# Patient Record
Sex: Female | Born: 1975 | Race: White | Hispanic: No | Marital: Single | Smoking: Never smoker
Health system: Southern US, Community
[De-identification: ages and names within clinical notes are randomized; demographics above are authoritative.]

## PROBLEM LIST (undated history)

## (undated) DIAGNOSIS — Z789 Other specified health status: Secondary | ICD-10-CM

## (undated) HISTORY — PX: NO PAST SURGERIES: SHX2092

## (undated) HISTORY — PX: GALLBLADDER SURGERY: SHX652

---

## 2010-12-06 ENCOUNTER — Other Ambulatory Visit (HOSPITAL_COMMUNITY)
Admission: RE | Admit: 2010-12-06 | Discharge: 2010-12-06 | Disposition: A | Discharge status: Home / Self Care | Payer: Managed Care, Other (non HMO) | Source: Ambulatory Visit | Attending: Obstetrics and Gynecology | Admitting: Obstetrics and Gynecology

## 2010-12-06 DIAGNOSIS — Z01419 Encounter for gynecological examination (general) (routine) without abnormal findings: Secondary | ICD-10-CM | POA: Insufficient documentation

## 2010-12-06 DIAGNOSIS — Z1159 Encounter for screening for other viral diseases: Secondary | ICD-10-CM | POA: Insufficient documentation

## 2010-12-06 DIAGNOSIS — Z113 Encounter for screening for infections with a predominantly sexual mode of transmission: Secondary | ICD-10-CM | POA: Insufficient documentation

## 2011-03-03 ENCOUNTER — Ambulatory Visit
Admission: RE | Admit: 2011-03-03 | Discharge: 2011-03-03 | Disposition: A | Discharge status: Home / Self Care | Payer: Managed Care, Other (non HMO) | Source: Ambulatory Visit | Attending: Family Medicine | Admitting: Family Medicine

## 2011-03-03 ENCOUNTER — Other Ambulatory Visit: Payer: Self-pay | Admitting: Family Medicine

## 2011-03-03 DIAGNOSIS — Z0000 Encounter for general adult medical examination without abnormal findings: Secondary | ICD-10-CM

## 2012-02-08 ENCOUNTER — Other Ambulatory Visit (HOSPITAL_COMMUNITY)
Admission: RE | Admit: 2012-02-08 | Discharge: 2012-02-08 | Disposition: A | Discharge status: Home / Self Care | Payer: BC Managed Care – PPO | Source: Ambulatory Visit | Attending: Obstetrics and Gynecology | Admitting: Obstetrics and Gynecology

## 2012-02-08 DIAGNOSIS — Z01419 Encounter for gynecological examination (general) (routine) without abnormal findings: Secondary | ICD-10-CM | POA: Insufficient documentation

## 2014-01-10 IMAGING — CR DG CHEST 2V
2 series · 2 of 2 positions shown · non-contrast
Comparison: None.

CLINICAL DATA: Positive TB skin test, routine physical exam

CHEST - 2 VIEW

[view not recorded (1 of 2)]
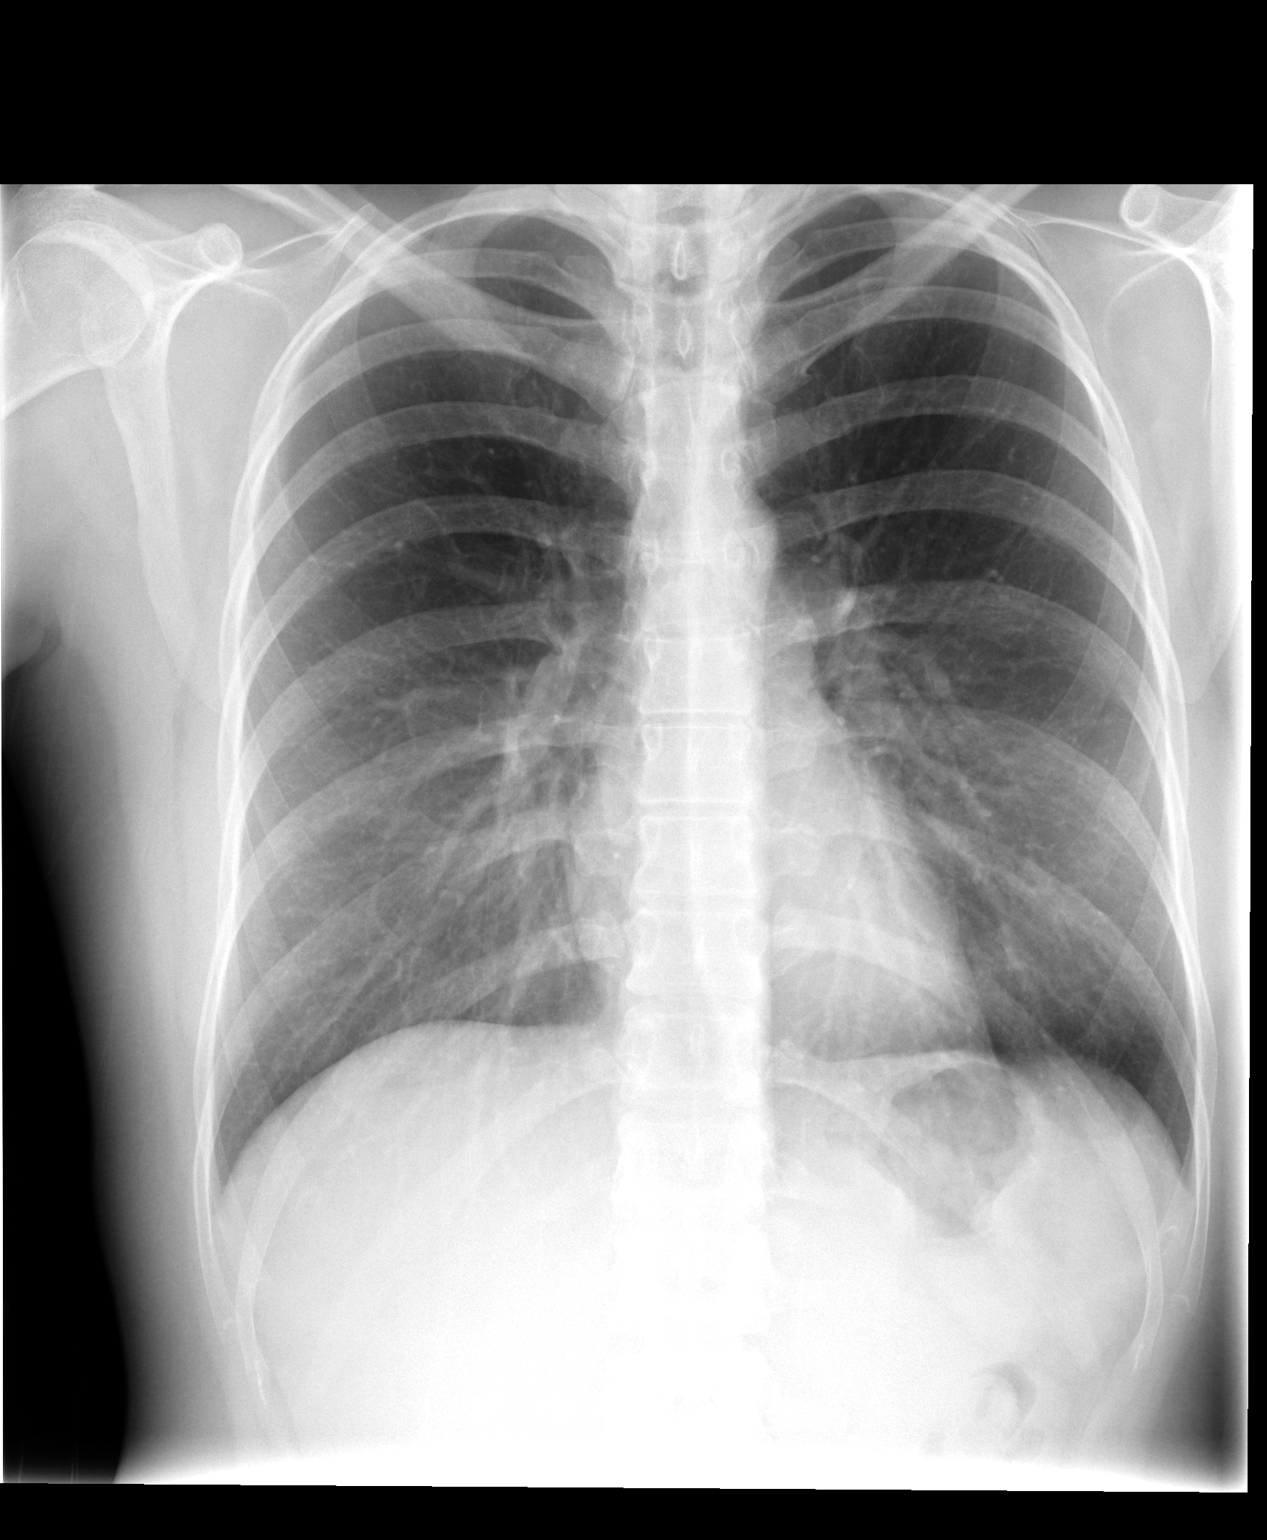

[view not recorded (2 of 2)]
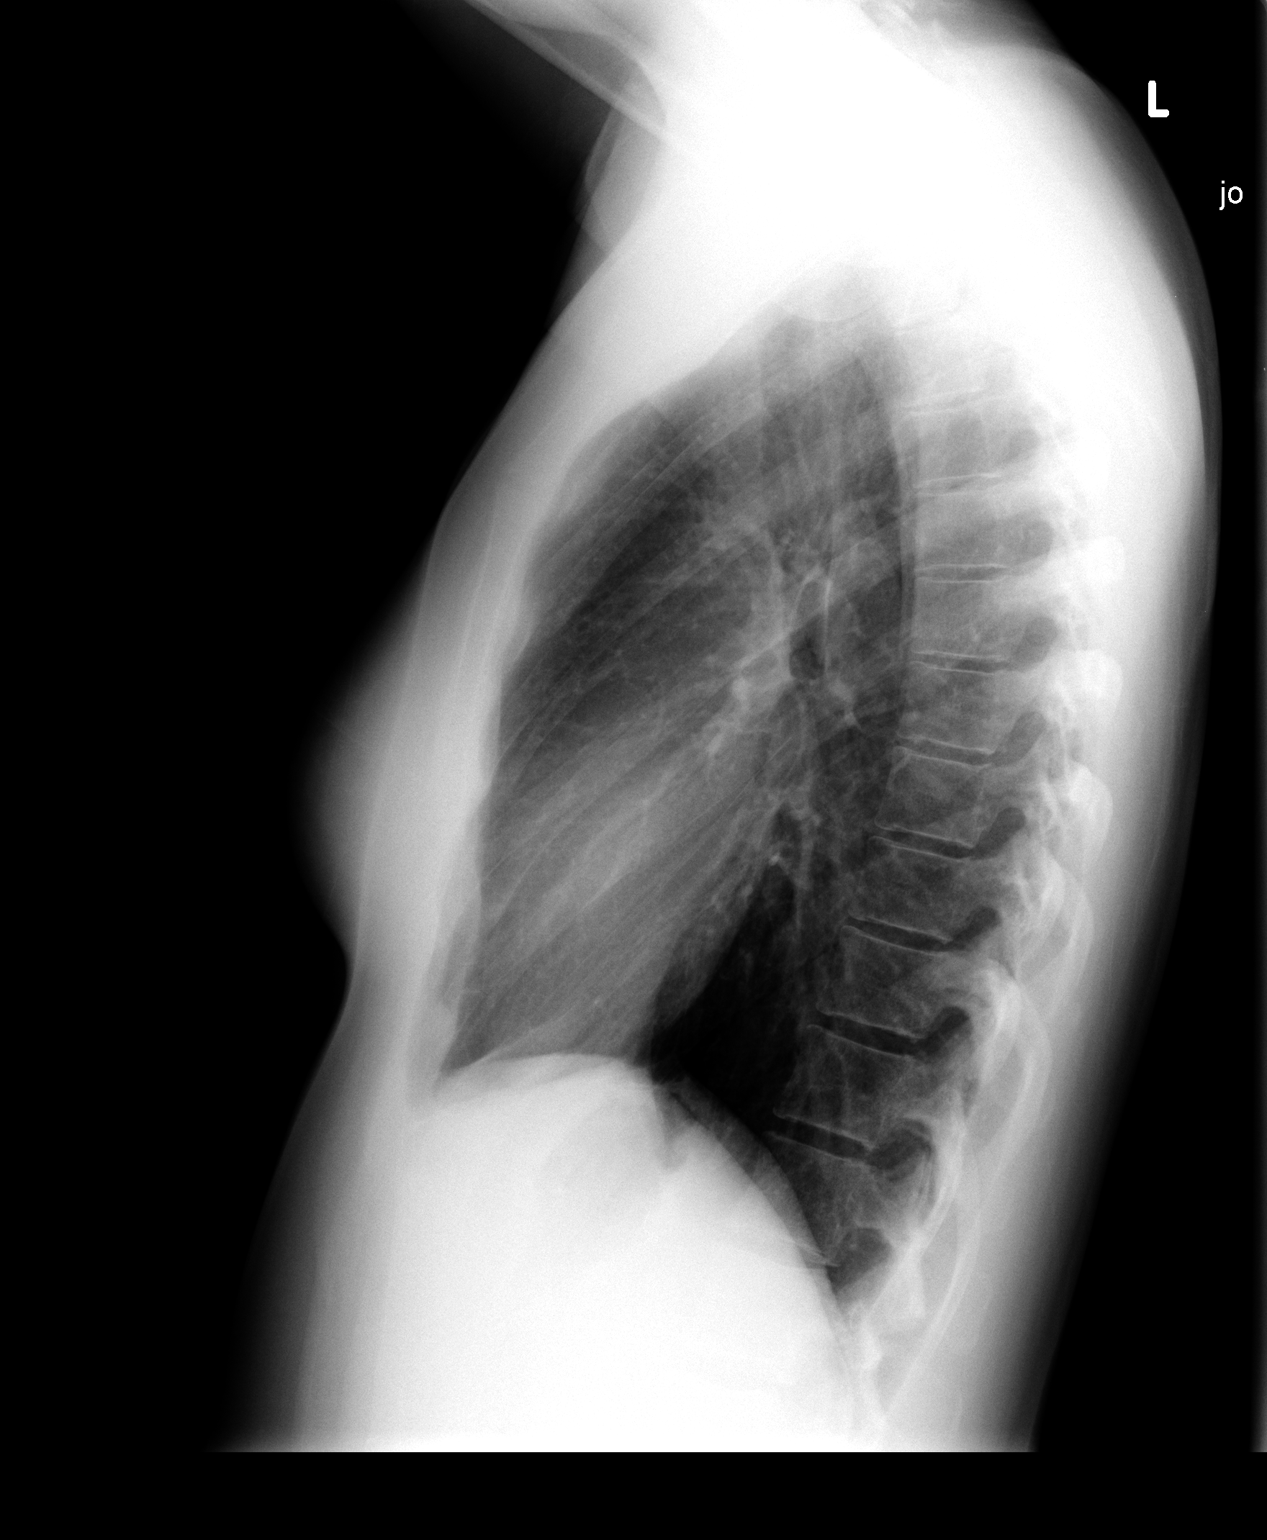

[2 of 2 positions shown; findings below may reference images not displayed]

FINDINGS: The lungs are clear.  No sequelae of prior tuberculous
infection is seen.  Mediastinal contours are normal.  The heart is
within normal limits in size.  No bony abnormality is seen.
IMPRESSION: No active lung disease.

## 2014-10-27 LAB — OB RESULTS CONSOLE ABO/RH: RH Type: POSITIVE

## 2014-10-27 LAB — OB RESULTS CONSOLE HEPATITIS B SURFACE ANTIGEN: HEP B S AG: NEGATIVE

## 2014-10-27 LAB — OB RESULTS CONSOLE RUBELLA ANTIBODY, IGM: RUBELLA: IMMUNE

## 2014-10-27 LAB — OB RESULTS CONSOLE HIV ANTIBODY (ROUTINE TESTING): HIV: NONREACTIVE

## 2014-10-27 LAB — OB RESULTS CONSOLE ANTIBODY SCREEN: Antibody Screen: NEGATIVE

## 2014-10-27 LAB — OB RESULTS CONSOLE RPR: RPR: NONREACTIVE

## 2014-11-02 LAB — OB RESULTS CONSOLE GC/CHLAMYDIA
CHLAMYDIA, DNA PROBE: NEGATIVE
GC PROBE AMP, GENITAL: NEGATIVE

## 2015-02-21 NOTE — L&D Delivery Note (Signed)
Delivery Note At 9:47 PM a viable and healthy female was delivered via Vaginal, Spontaneous Delivery (Presentation: Right Occiput Anterior).  APGAR: 8, 10; weight pending  .   Placenta status: Intact, Spontaneous.  Cord:  Cord around arm 3 vessels with the following complications: None.  Cord pH: none  Anesthesia: Local  Episiotomy: None Lacerations:  Left vaginal Sulcus, sub clitoral superficial laceration Suture Repair: 3.0 chromic Est. Blood Loss (mL): 300  Mom to postpartum.  Baby to Couplet care / Skin to Skin.  Jillian Pianka A 05/10/2015, 10:31 PM

## 2015-04-21 LAB — OB RESULTS CONSOLE GBS: GBS: NEGATIVE

## 2015-05-10 ENCOUNTER — Inpatient Hospital Stay (HOSPITAL_COMMUNITY)
Admission: AD | Admit: 2015-05-10 | Discharge: 2015-05-12 | DRG: 775 | Disposition: A | Discharge status: Home / Self Care | Payer: BC Managed Care – PPO | Source: Ambulatory Visit | Attending: Obstetrics and Gynecology | Admitting: Obstetrics and Gynecology

## 2015-05-10 ENCOUNTER — Inpatient Hospital Stay (HOSPITAL_COMMUNITY)
Admission: AD | Admit: 2015-05-10 | Discharge: 2015-05-10 | Disposition: A | Discharge status: Home / Self Care | Payer: BC Managed Care – PPO | Source: Ambulatory Visit | Attending: Obstetrics and Gynecology | Admitting: Obstetrics and Gynecology

## 2015-05-10 ENCOUNTER — Encounter (HOSPITAL_COMMUNITY): Payer: Self-pay | Admitting: *Deleted

## 2015-05-10 DIAGNOSIS — Z3A38 38 weeks gestation of pregnancy: Secondary | ICD-10-CM

## 2015-05-10 DIAGNOSIS — IMO0001 Reserved for inherently not codable concepts without codable children: Secondary | ICD-10-CM

## 2015-05-10 HISTORY — DX: Other specified health status: Z78.9

## 2015-05-10 LAB — URINE MICROSCOPIC-ADD ON

## 2015-05-10 LAB — CBC
HEMATOCRIT: 40 % (ref 36.0–46.0)
HEMOGLOBIN: 14 g/dL (ref 12.0–15.0)
MCH: 30 pg (ref 26.0–34.0)
MCHC: 35 g/dL (ref 30.0–36.0)
MCV: 85.7 fL (ref 78.0–100.0)
Platelets: 227 10*3/uL (ref 150–400)
RBC: 4.67 MIL/uL (ref 3.87–5.11)
RDW: 14.5 % (ref 11.5–15.5)
WBC: 23.5 10*3/uL — AB (ref 4.0–10.5)

## 2015-05-10 LAB — URINALYSIS, ROUTINE W REFLEX MICROSCOPIC
BILIRUBIN URINE: NEGATIVE
Glucose, UA: NEGATIVE mg/dL
HGB URINE DIPSTICK: NEGATIVE
KETONES UR: 15 mg/dL — AB
Leukocytes, UA: NEGATIVE
Nitrite: NEGATIVE
PH: 6 (ref 5.0–8.0)
Protein, ur: 30 mg/dL — AB
SPECIFIC GRAVITY, URINE: 1.025 (ref 1.005–1.030)

## 2015-05-10 LAB — TYPE AND SCREEN
ABO/RH(D): O POS
ANTIBODY SCREEN: NEGATIVE

## 2015-05-10 MED ORDER — LIDOCAINE HCL (PF) 1 % IJ SOLN
30.0000 mL | INTRAMUSCULAR | Status: DC | PRN
  Administered 2015-05-10: 30 mL via SUBCUTANEOUS
  Filled 2015-05-10: qty 30

## 2015-05-10 MED ORDER — LIDOCAINE HCL (PF) 1 % IJ SOLN
INTRAMUSCULAR | Status: AC
  Administered 2015-05-10: 30 mL via SUBCUTANEOUS
  Filled 2015-05-10: qty 30

## 2015-05-10 MED ORDER — OXYTOCIN BOLUS FROM INFUSION
500.0000 mL | INTRAVENOUS | Status: DC
  Administered 2015-05-10: 500 mL via INTRAVENOUS

## 2015-05-10 MED ORDER — ONDANSETRON HCL 4 MG/2ML IJ SOLN: 4.0000 mg | Freq: Four times a day (QID) | INTRAMUSCULAR | Status: DC | PRN

## 2015-05-10 MED ORDER — OXYTOCIN 10 UNIT/ML IJ SOLN: 10.0000 [IU] | Freq: Once | INTRAMUSCULAR | Status: DC

## 2015-05-10 MED ORDER — OXYTOCIN 10 UNIT/ML IJ SOLN: 2.5000 [IU]/h | INTRAVENOUS | Status: DC

## 2015-05-10 MED ORDER — ACETAMINOPHEN 325 MG PO TABS: 650.0000 mg | ORAL_TABLET | ORAL | Status: DC | PRN

## 2015-05-10 MED ORDER — LACTATED RINGERS IV SOLN
INTRAVENOUS | Status: DC
  Administered 2015-05-10 (×2): via INTRAVENOUS

## 2015-05-10 MED ORDER — OXYTOCIN 10 UNIT/ML IJ SOLN
INTRAMUSCULAR | Status: AC
  Filled 2015-05-10: qty 4

## 2015-05-10 MED ORDER — CITRIC ACID-SODIUM CITRATE 334-500 MG/5ML PO SOLN: 30.0000 mL | ORAL | Status: DC | PRN

## 2015-05-10 MED ORDER — LACTATED RINGERS IV SOLN: 500.0000 mL | INTRAVENOUS | Status: DC | PRN

## 2015-05-10 NOTE — MAU Note (Signed)
Patient was discharged from MAU around 11am and contractions have gotten more intense.  Every 3 minutes. Denies LOF or vaginal bleeding. Reports +fetal movement.  SVE was 2.5cm when dc'd home at 1100.

## 2015-05-10 NOTE — MAU Note (Signed)
Contractions started around noon yesterday, again this morning, now every 5 min. Little bit of bloody mucous. No leaking

## 2015-05-10 NOTE — H&P (Signed)
Lydia Lawrence is a 40 y.o. female presenting in active labor. Pt had been seen on several occasions for false labor. SROM @ 1950..clear fluid (+) FM GBS cx neg. On arrival, pt was complete  Maternal Medical History:  Reason for admission: Rupture of membranes and contractions.   Contractions: Onset was 2 days ago.    Fetal activity: Perceived fetal activity is normal.    Prenatal complications: no prenatal complications Prenatal Complications - Diabetes: none.    OB History    Gravida Para Term Preterm AB TAB SAB Ectopic Multiple Living   1              No past medical history on file. No past surgical history on file. Family History: family history is not on file. Social History:  reports that she has never smoked. She does not have any smokeless tobacco history on file. She reports that she does not drink alcohol or use illicit drugs.   Prenatal Transfer Tool  Maternal Diabetes: No Genetic Screening: Normal Maternal Ultrasounds/Referrals: Normal Fetal Ultrasounds or other Referrals:  None Maternal Substance Abuse:  No Significant Maternal Medications:  Meds include: Zoloft Significant Maternal Lab Results:  Lab values include: Group B Strep negative Other Comments:  hx depression and anxiety  Review of Systems  All other systems reviewed and are negative.   Dilation: 10 Effacement (%): 100 Station: 0 Exam by:: Claud Kelprue Harris RN There were no vitals taken for this visit. Maternal Exam:  Uterine Assessment: Contraction strength is moderate.  Abdomen: Patient reports no abdominal tenderness. Estimated fetal weight is 6 1/2 lb.   Fetal presentation: vertex  Introitus: Normal vulva.   Physical Exam  Constitutional: She is oriented to person, place, and time. She appears well-developed and well-nourished.  HENT:  Head: Atraumatic.  Eyes: EOM are normal.  Neck: Neck supple.  Cardiovascular: Normal rate.   Respiratory: Effort normal.  GI: Soft.  Musculoskeletal: She  exhibits no edema.  Neurological: She is alert and oriented to person, place, and time.  Skin: Skin is warm and dry.    Prenatal labs: ABO, Rh:  O positive Antibody:  neg Rubella:  Immune RPR:   NR HBsAg:   neg HIV:   NR GBS:   neg  Assessment/Plan: Complete Hx depression & anxiety IUP @ 38 weeks P) admit. Routine labs. Cont zoloft. Anticipate vaginal delivery  Lydia Lawrence A 05/10/2015, 9:10 PM

## 2015-05-10 NOTE — Progress Notes (Signed)
Patient's cervix is unchanged from this morning's assessment.  Patient has been discharged by Dr. Purnell Shoemakerousin's and patient is refusing to go home.  Will have MAU manager speak with patient if continues to refuse.  Patient not in labor and is in stable condition.

## 2015-05-10 NOTE — Discharge Instructions (Signed)
Fetal Movement Counts Patient Name: __________________________________________________ Patient Due Date: ____________________ Performing a fetal movement count is highly recommended in high-risk pregnancies, but it is good for every pregnant woman to do. Your health care provider may ask you to start counting fetal movements at 28 weeks of the pregnancy. Fetal movements often increase:  After eating a full meal.  After physical activity.  After eating or drinking something sweet or cold.  At rest. Pay attention to when you feel the baby is most active. This will help you notice a pattern of your baby's sleep and wake cycles and what factors contribute to an increase in fetal movement. It is important to perform a fetal movement count at the same time each day when your baby is normally most active.  HOW TO COUNT FETAL MOVEMENTS 1. Find a quiet and comfortable area to sit or lie down on your left side. Lying on your left side provides the best blood and oxygen circulation to your baby. 2. Write down the day and time on a sheet of paper or in a journal. 3. Start counting kicks, flutters, swishes, rolls, or jabs in a 2-hour period. You should feel at least 10 movements within 2 hours. 4. If you do not feel 10 movements in 2 hours, wait 2-3 hours and count again. Look for a change in the pattern or not enough counts in 2 hours. SEEK MEDICAL CARE IF:  You feel less than 10 counts in 2 hours, tried twice.  There is no movement in over an hour.  The pattern is changing or taking longer each day to reach 10 counts in 2 hours.  You feel the baby is not moving as he or she usually does. Date: ____________ Movements: ____________ Start time: ____________ Finish time: ____________  Date: ____________ Movements: ____________ Start time: ____________ Finish time: ____________ Date: ____________ Movements: ____________ Start time: ____________ Finish time: ____________ Date: ____________ Movements:  ____________ Start time: ____________ Finish time: ____________ Date: ____________ Movements: ____________ Start time: ____________ Finish time: ____________ Date: ____________ Movements: ____________ Start time: ____________ Finish time: ____________ Date: ____________ Movements: ____________ Start time: ____________ Finish time: ____________ Date: ____________ Movements: ____________ Start time: ____________ Finish time: ____________  Date: ____________ Movements: ____________ Start time: ____________ Finish time: ____________ Date: ____________ Movements: ____________ Start time: ____________ Finish time: ____________ Date: ____________ Movements: ____________ Start time: ____________ Finish time: ____________ Date: ____________ Movements: ____________ Start time: ____________ Finish time: ____________ Date: ____________ Movements: ____________ Start time: ____________ Finish time: ____________ Date: ____________ Movements: ____________ Start time: ____________ Finish time: ____________ Date: ____________ Movements: ____________ Start time: ____________ Finish time: ____________  Date: ____________ Movements: ____________ Start time: ____________ Finish time: ____________ Date: ____________ Movements: ____________ Start time: ____________ Finish time: ____________ Date: ____________ Movements: ____________ Start time: ____________ Finish time: ____________ Date: ____________ Movements: ____________ Start time: ____________ Finish time: ____________ Date: ____________ Movements: ____________ Start time: ____________ Finish time: ____________ Date: ____________ Movements: ____________ Start time: ____________ Finish time: ____________ Date: ____________ Movements: ____________ Start time: ____________ Finish time: ____________  Date: ____________ Movements: ____________ Start time: ____________ Finish time: ____________ Date: ____________ Movements: ____________ Start time: ____________ Finish  time: ____________ Date: ____________ Movements: ____________ Start time: ____________ Finish time: ____________ Date: ____________ Movements: ____________ Start time: ____________ Finish time: ____________ Date: ____________ Movements: ____________ Start time: ____________ Finish time: ____________ Date: ____________ Movements: ____________ Start time: ____________ Finish time: ____________ Date: ____________ Movements: ____________ Start time: ____________ Finish time: ____________  Date: ____________ Movements: ____________ Start time: ____________ Finish   time: ____________ Date: ____________ Movements: ____________ Start time: ____________ Finish time: ____________ Date: ____________ Movements: ____________ Start time: ____________ Finish time: ____________ Date: ____________ Movements: ____________ Start time: ____________ Finish time: ____________ Date: ____________ Movements: ____________ Start time: ____________ Finish time: ____________ Date: ____________ Movements: ____________ Start time: ____________ Finish time: ____________ Date: ____________ Movements: ____________ Start time: ____________ Finish time: ____________  Date: ____________ Movements: ____________ Start time: ____________ Finish time: ____________ Date: ____________ Movements: ____________ Start time: ____________ Finish time: ____________ Date: ____________ Movements: ____________ Start time: ____________ Finish time: ____________ Date: ____________ Movements: ____________ Start time: ____________ Finish time: ____________ Date: ____________ Movements: ____________ Start time: ____________ Finish time: ____________ Date: ____________ Movements: ____________ Start time: ____________ Finish time: ____________ Date: ____________ Movements: ____________ Start time: ____________ Finish time: ____________  Date: ____________ Movements: ____________ Start time: ____________ Finish time: ____________ Date: ____________  Movements: ____________ Start time: ____________ Finish time: ____________ Date: ____________ Movements: ____________ Start time: ____________ Finish time: ____________ Date: ____________ Movements: ____________ Start time: ____________ Finish time: ____________ Date: ____________ Movements: ____________ Start time: ____________ Finish time: ____________ Date: ____________ Movements: ____________ Start time: ____________ Finish time: ____________ Date: ____________ Movements: ____________ Start time: ____________ Finish time: ____________  Date: ____________ Movements: ____________ Start time: ____________ Finish time: ____________ Date: ____________ Movements: ____________ Start time: ____________ Finish time: ____________ Date: ____________ Movements: ____________ Start time: ____________ Finish time: ____________ Date: ____________ Movements: ____________ Start time: ____________ Finish time: ____________ Date: ____________ Movements: ____________ Start time: ____________ Finish time: ____________ Date: ____________ Movements: ____________ Start time: ____________ Finish time: ____________   This information is not intended to replace advice given to you by your health care provider. Make sure you discuss any questions you have with your health care provider.   Document Released: 03/08/2006 Document Revised: 02/27/2014 Document Reviewed: 12/04/2011 Else[employer] Interactive Patient Education 2016 [employer] Braxton Hicks Contractions Contractions of the uterus can occur throughout pregnancy. Contractions are not always a sign that you are in labor.  WHAT ARE BRAXTON HICKS CONTRACTIONS?  Contractions that occur before labor are called Braxton Hicks contractions, or false labor. Toward the end of pregnancy (32-34 weeks), these contractions can develop more often and may become more forceful. This is not true labor because these contractions do not result in opening (dilatation) and thinning of  the cervix. They are sometimes difficult to tell apart from true labor because these contractions can be forceful and people have different pain tolerances. You should not feel embarrassed if you go to the hospital with false labor. Sometimes, the only way to tell if you are in true labor is for your health care provider to look for changes in the cervix. If there are no prenatal problems or other health problems associated with the pregnancy, it is completely safe to be sent home with false labor and await the onset of true labor. HOW CAN YOU TELL THE DIFFERENCE BETWEEN TRUE AND FALSE LABOR? False Labor  The contractions of false labor are usually shorter and not as hard as those of true labor.   The contractions are usually irregular.   The contractions are often felt in the front of the lower abdomen and in the groin.   The contractions may go away when you walk around or change positions while lying down.   The contractions get weaker and are shorter lasting as time goes on.   The contractions do not usually become progressively stronger, regular, and closer together as with true labor.  True Labor 5. Contractions in true   labor last 30-70 seconds, become very regular, usually become more intense, and increase in frequency.  6. The contractions do not go away with walking.  7. The discomfort is usually felt in the top of the uterus and spreads to the lower abdomen and low back.  8. True labor can be determined by your health care provider with an exam. This will show that the cervix is dilating and getting thinner.  WHAT TO REMEMBER  Keep up with your usual exercises and follow other instructions given by your health care provider.   Take medicines as directed by your health care provider.   Keep your regular prenatal appointments.   Eat and drink lightly if you think you are going into labor.   If Braxton Hicks contractions are making you uncomfortable:   Change  your position from lying down or resting to walking, or from walking to resting.   Sit and rest in a tub of warm water.   Drink 2-3 glasses of water. Dehydration may cause these contractions.   Do slow and deep breathing several times an hour.  WHEN SHOULD I SEEK IMMEDIATE MEDICAL CARE? Seek immediate medical care if:  Your contractions become stronger, more regular, and closer together.   You have fluid leaking or gushing from your vagina.   You have a fever.   You pass blood-tinged mucus.   You have vaginal bleeding.   You have continuous abdominal pain.   You have low back pain that you never had before.   You feel your baby's head pushing down and causing pelvic pressure.   Your baby is not moving as much as it used to.    This information is not intended to replace advice given to you by your health care provider. Make sure you discuss any questions you have with your health care provider.   Document Released: 02/06/2005 Document Revised: 02/11/2013 Document Reviewed: 11/18/2012 Else[employer] Interactive Patient Education 2016 [employer]  

## 2015-05-10 NOTE — Discharge Instructions (Signed)
Braxton Hicks Contractions °Contractions of the uterus can occur throughout pregnancy. Contractions are not always a sign that you are in labor.  °WHAT ARE BRAXTON HICKS CONTRACTIONS?  °Contractions that occur before labor are called Braxton Hicks contractions, or false labor. Toward the end of pregnancy (32-34 weeks), these contractions can develop more often and may become more forceful. This is not true labor because these contractions do not result in opening (dilatation) and thinning of the cervix. They are sometimes difficult to tell apart from true labor because these contractions can be forceful and people have different pain tolerances. You should not feel embarrassed if you go to the hospital with false labor. Sometimes, the only way to tell if you are in true labor is for your health care provider to look for changes in the cervix. °If there are no prenatal problems or other health problems associated with the pregnancy, it is completely safe to be sent home with false labor and await the onset of true labor. °HOW CAN YOU TELL THE DIFFERENCE BETWEEN TRUE AND FALSE LABOR? °False Labor °· The contractions of false labor are usually shorter and not as hard as those of true labor.   °· The contractions are usually irregular.   °· The contractions are often felt in the front of the lower abdomen and in the groin.   °· The contractions may go away when you walk around or change positions while lying down.   °· The contractions get weaker and are shorter lasting as time goes on.   °· The contractions do not usually become progressively stronger, regular, and closer together as with true labor.   °True Labor °1. Contractions in true labor last 30-70 seconds, become very regular, usually become more intense, and increase in frequency.   °2. The contractions do not go away with walking.   °3. The discomfort is usually felt in the top of the uterus and spreads to the lower abdomen and low back.   °4. True labor can  be determined by your health care provider with an exam. This will show that the cervix is dilating and getting thinner.   °WHAT TO REMEMBER °· Keep up with your usual exercises and follow other instructions given by your health care provider.   °· Take medicines as directed by your health care provider.   °· Keep your regular prenatal appointments.   °· Eat and drink lightly if you think you are going into labor.   °· If Braxton Hicks contractions are making you uncomfortable:   °· Change your position from lying down or resting to walking, or from walking to resting.   °· Sit and rest in a tub of warm water.   °· Drink 2-3 glasses of water. Dehydration may cause these contractions.   °· Do slow and deep breathing several times an hour.   °WHEN SHOULD I SEEK IMMEDIATE MEDICAL CARE? °Seek immediate medical care if: °· Your contractions become stronger, more regular, and closer together.   °· You have fluid leaking or gushing from your vagina.   °· You have a fever.   °· You pass blood-tinged mucus.   °· You have vaginal bleeding.   °· You have continuous abdominal pain.   °· You have low back pain that you never had before.   °· You feel your baby's head pushing down and causing pelvic pressure.   °· Your baby is not moving as much as it used to.   °  °This information is not intended to replace advice given to you by your health care provider. Make sure you discuss any questions you have with your health care   provider. °  °Document Released: 02/06/2005 Document Revised: 02/11/2013 Document Reviewed: 11/18/2012 °Elsevier Interactive Patient Education ©2016 [employer] ° °Fetal Movement Counts °Patient Name: __________________________________________________ Patient Due Date: ____________________ °Performing a fetal movement count is highly recommended in high-risk pregnancies, but it is good for every pregnant woman to do. Your health care provider may ask you to start counting fetal movements at 28 weeks of the  pregnancy. Fetal movements often increase: °· After eating a full meal. °· After physical activity. °· After eating or drinking something sweet or cold. °· At rest. °Pay attention to when you feel the baby is most active. This will help you notice a pattern of your baby's sleep and wake cycles and what factors contribute to an increase in fetal movement. It is important to perform a fetal movement count at the same time each day when your baby is normally most active.  °HOW TO COUNT FETAL MOVEMENTS °5. Find a quiet and comfortable area to sit or lie down on your left side. Lying on your left side provides the best blood and oxygen circulation to your baby. °6. Write down the day and time on a sheet of paper or in a journal. °7. Start counting kicks, flutters, swishes, rolls, or jabs in a 2-hour period. You should feel at least 10 movements within 2 hours. °8. If you do not feel 10 movements in 2 hours, wait 2-3 hours and count again. Look for a change in the pattern or not enough counts in 2 hours. °SEEK MEDICAL CARE IF: °· You feel less than 10 counts in 2 hours, tried twice. °· There is no movement in over an hour. °· The pattern is changing or taking longer each day to reach 10 counts in 2 hours. °· You feel the baby is not moving as he or she usually does. °Date: ____________ Movements: ____________ Start time: ____________ Finish time: ____________  °Date: ____________ Movements: ____________ Start time: ____________ Finish time: ____________ °Date: ____________ Movements: ____________ Start time: ____________ Finish time: ____________ °Date: ____________ Movements: ____________ Start time: ____________ Finish time: ____________ °Date: ____________ Movements: ____________ Start time: ____________ Finish time: ____________ °Date: ____________ Movements: ____________ Start time: ____________ Finish time: ____________ °Date: ____________ Movements: ____________ Start time: ____________ Finish time:  ____________ °Date: ____________ Movements: ____________ Start time: ____________ Finish time: ____________  °Date: ____________ Movements: ____________ Start time: ____________ Finish time: ____________ °Date: ____________ Movements: ____________ Start time: ____________ Finish time: ____________ °Date: ____________ Movements: ____________ Start time: ____________ Finish time: ____________ °Date: ____________ Movements: ____________ Start time: ____________ Finish time: ____________ °Date: ____________ Movements: ____________ Start time: ____________ Finish time: ____________ °Date: ____________ Movements: ____________ Start time: ____________ Finish time: ____________ °Date: ____________ Movements: ____________ Start time: ____________ Finish time: ____________  °Date: ____________ Movements: ____________ Start time: ____________ Finish time: ____________ °Date: ____________ Movements: ____________ Start time: ____________ Finish time: ____________ °Date: ____________ Movements: ____________ Start time: ____________ Finish time: ____________ °Date: ____________ Movements: ____________ Start time: ____________ Finish time: ____________ °Date: ____________ Movements: ____________ Start time: ____________ Finish time: ____________ °Date: ____________ Movements: ____________ Start time: ____________ Finish time: ____________ °Date: ____________ Movements: ____________ Start time: ____________ Finish time: ____________  °Date: ____________ Movements: ____________ Start time: ____________ Finish time: ____________ °Date: ____________ Movements: ____________ Start time: ____________ Finish time: ____________ °Date: ____________ Movements: ____________ Start time: ____________ Finish time: ____________ °Date: ____________ Movements: ____________ Start time: ____________ Finish time: ____________ °Date: ____________ Movements: ____________ Start time: ____________ Finish time: ____________ °Date: ____________ Movements:  ____________ Start time: ____________ Finish   time: ____________ °Date: ____________ Movements: ____________ Start time: ____________ Finish time: ____________  °Date: ____________ Movements: ____________ Start time: ____________ Finish time: ____________ °Date: ____________ Movements: ____________ Start time: ____________ Finish time: ____________ °Date: ____________ Movements: ____________ Start time: ____________ Finish time: ____________ °Date: ____________ Movements: ____________ Start time: ____________ Finish time: ____________ °Date: ____________ Movements: ____________ Start time: ____________ Finish time: ____________ °Date: ____________ Movements: ____________ Start time: ____________ Finish time: ____________ °Date: ____________ Movements: ____________ Start time: ____________ Finish time: ____________  °Date: ____________ Movements: ____________ Start time: ____________ Finish time: ____________ °Date: ____________ Movements: ____________ Start time: ____________ Finish time: ____________ °Date: ____________ Movements: ____________ Start time: ____________ Finish time: ____________ °Date: ____________ Movements: ____________ Start time: ____________ Finish time: ____________ °Date: ____________ Movements: ____________ Start time: ____________ Finish time: ____________ °Date: ____________ Movements: ____________ Start time: ____________ Finish time: ____________ °Date: ____________ Movements: ____________ Start time: ____________ Finish time: ____________  °Date: ____________ Movements: ____________ Start time: ____________ Finish time: ____________ °Date: ____________ Movements: ____________ Start time: ____________ Finish time: ____________ °Date: ____________ Movements: ____________ Start time: ____________ Finish time: ____________ °Date: ____________ Movements: ____________ Start time: ____________ Finish time: ____________ °Date: ____________ Movements: ____________ Start time: ____________ Finish  time: ____________ °Date: ____________ Movements: ____________ Start time: ____________ Finish time: ____________ °Date: ____________ Movements: ____________ Start time: ____________ Finish time: ____________  °Date: ____________ Movements: ____________ Start time: ____________ Finish time: ____________ °Date: ____________ Movements: ____________ Start time: ____________ Finish time: ____________ °Date: ____________ Movements: ____________ Start time: ____________ Finish time: ____________ °Date: ____________ Movements: ____________ Start time: ____________ Finish time: ____________ °Date: ____________ Movements: ____________ Start time: ____________ Finish time: ____________ °Date: ____________ Movements: ____________ Start time: ____________ Finish time: ____________ °  °This information is not intended to replace advice given to you by your health care provider. Make sure you discuss any questions you have with your health care provider. °  °Document Released: 03/08/2006 Document Revised: 02/27/2014 Document Reviewed: 12/04/2011 °Elsevier Interactive Patient Education ©2016 Elsevier Inc. ° °

## 2015-05-11 ENCOUNTER — Encounter (HOSPITAL_COMMUNITY): Payer: Self-pay | Admitting: Obstetrics and Gynecology

## 2015-05-11 LAB — CBC
HCT: 32.9 % — ABNORMAL LOW (ref 36.0–46.0)
HEMOGLOBIN: 11.1 g/dL — AB (ref 12.0–15.0)
MCH: 29.4 pg (ref 26.0–34.0)
MCHC: 33.7 g/dL (ref 30.0–36.0)
MCV: 87 fL (ref 78.0–100.0)
Platelets: 229 10*3/uL (ref 150–400)
RBC: 3.78 MIL/uL — ABNORMAL LOW (ref 3.87–5.11)
RDW: 14.7 % (ref 11.5–15.5)
WBC: 20.2 10*3/uL — ABNORMAL HIGH (ref 4.0–10.5)

## 2015-05-11 LAB — ABO/RH: ABO/RH(D): O POS

## 2015-05-11 LAB — RPR: RPR: NONREACTIVE

## 2015-05-11 MED ORDER — SERTRALINE HCL 25 MG PO TABS
25.0000 mg | ORAL_TABLET | Freq: Every day | ORAL | Status: DC
  Administered 2015-05-11 (×2): 25 mg via ORAL
  Filled 2015-05-11 (×4): qty 1

## 2015-05-11 MED ORDER — ONDANSETRON HCL 4 MG PO TABS: 4.0000 mg | ORAL_TABLET | ORAL | Status: DC | PRN

## 2015-05-11 MED ORDER — IBUPROFEN 600 MG PO TABS
600.0000 mg | ORAL_TABLET | Freq: Four times a day (QID) | ORAL | Status: DC
  Administered 2015-05-11 – 2015-05-12 (×6): 600 mg via ORAL
  Filled 2015-05-11 (×6): qty 1

## 2015-05-11 MED ORDER — FERROUS SULFATE 325 (65 FE) MG PO TABS
325.0000 mg | ORAL_TABLET | Freq: Two times a day (BID) | ORAL | Status: DC
  Administered 2015-05-11 – 2015-05-12 (×3): 325 mg via ORAL
  Filled 2015-05-11 (×3): qty 1

## 2015-05-11 MED ORDER — SENNOSIDES-DOCUSATE SODIUM 8.6-50 MG PO TABS
2.0000 | ORAL_TABLET | ORAL | Status: DC
  Administered 2015-05-11 (×2): 2 via ORAL
  Filled 2015-05-11 (×2): qty 2

## 2015-05-11 MED ORDER — ONDANSETRON HCL 4 MG/2ML IJ SOLN: 4.0000 mg | INTRAMUSCULAR | Status: DC | PRN

## 2015-05-11 MED ORDER — BENZOCAINE-MENTHOL 20-0.5 % EX AERO: 1.0000 "application " | INHALATION_SPRAY | CUTANEOUS | Status: DC | PRN

## 2015-05-11 MED ORDER — SERTRALINE HCL 25 MG PO TABS
25.0000 mg | ORAL_TABLET | Freq: Every day | ORAL | Status: DC
  Filled 2015-05-11: qty 1

## 2015-05-11 MED ORDER — ACETAMINOPHEN 325 MG PO TABS
650.0000 mg | ORAL_TABLET | ORAL | Status: DC | PRN
  Administered 2015-05-11 (×2): 650 mg via ORAL
  Filled 2015-05-11 (×2): qty 2

## 2015-05-11 MED ORDER — SIMETHICONE 80 MG PO CHEW: 80.0000 mg | CHEWABLE_TABLET | ORAL | Status: DC | PRN

## 2015-05-11 MED ORDER — LANOLIN HYDROUS EX OINT
TOPICAL_OINTMENT | CUTANEOUS | Status: DC | PRN
Start: 2015-05-11 — End: 2015-05-12

## 2015-05-11 MED ORDER — PRENATAL MULTIVITAMIN CH
1.0000 | ORAL_TABLET | Freq: Every day | ORAL | Status: DC
  Administered 2015-05-11 – 2015-05-12 (×2): 1 via ORAL
  Filled 2015-05-11 (×2): qty 1

## 2015-05-11 MED ORDER — WITCH HAZEL-GLYCERIN EX PADS: 1.0000 "application " | MEDICATED_PAD | CUTANEOUS | Status: DC | PRN

## 2015-05-11 MED ORDER — DIPHENHYDRAMINE HCL 25 MG PO CAPS: 25.0000 mg | ORAL_CAPSULE | Freq: Four times a day (QID) | ORAL | Status: DC | PRN

## 2015-05-11 MED ORDER — ZOLPIDEM TARTRATE 5 MG PO TABS: 5.0000 mg | ORAL_TABLET | Freq: Every evening | ORAL | Status: DC | PRN

## 2015-05-11 MED ORDER — DIBUCAINE 1 % RE OINT
1.0000 "application " | TOPICAL_OINTMENT | RECTAL | Status: DC | PRN
  Administered 2015-05-11: 1 via RECTAL
  Filled 2015-05-11: qty 28

## 2015-05-11 NOTE — Progress Notes (Signed)
Patient ID: Lydia SharkKaren Lawrence, female   DOB: [birth year], 40 y.o.   MRN: [id number] PPD # 1 SVD  S:  Reports feeling well.             Tolerating po/ No nausea or vomiting             Bleeding is light             Pain controlled with ibuprofen (OTC)             Up ad lib / ambulatory / voiding without difficulties    Newborn  Information for the patient's newborn:  Lydia Lawrence, Boy Lydia Lawrence [[id number]]  female  breast feeding  / Circumcision NOT planning   O:  A & O x 3, in no apparent distress              VS:  Filed Vitals:   05/10/15 2332 05/10/15 2350 05/11/15 0050 05/11/15 0455  BP: 107/64 107/73 110/66 112/58  Pulse: 104 108 99 100  Temp:  98.9 F (37.2 C) 97.9 F (36.6 C) 98.1 F (36.7 C)  TempSrc:  Oral Oral Oral  Resp:  18 18 16   Height:      Weight:      SpO2:        LABS:  Recent Labs  05/10/15 2100  WBC 23.5*  HGB 14.0  HCT 40.0  PLT 227    Blood type: O POS (03/20 2100)  Rubella: Immune (09/06 0000)   I&O: I/O last 3 completed shifts: In: -  Out: 300 [Blood:300]             Lungs: Clear and unlabored  Heart: regular rate and rhythm / no murmurs  Abdomen: soft, non-tender, non-distended             Fundus: firm, non-tender, U-1  Perineum: repair healing well, no edema  Lochia: minimal  Extremities: No edema, no calf pain or tenderness, No Homans    A/P: PPD # 1  40 y.o., G2P1001   Principal Problem:    Postpartum care following vaginal delivery (3/20)  Active Problems:    Active labor at term   Doing well - stable status  Routine post partum orders  Anticipate discharge tomorrow    Lydia Lawrence, Lydia Lawrence, M, MSN, CNM 05/11/2015, 9:27 AM

## 2015-05-11 NOTE — Lactation Note (Signed)
This note was copied from a baby's chart. Lactation Consultation Note  Patient Name: Lydia Jake SharkKaren Lawrence XBMWU'XToday's Date: 05/11/2015 Reason for consult: Follow-up assessment;Difficult latch  Baby 16 hours old. Mom reports that baby sleepy at breast. Assisted mom with latching baby to left breast in football, cross-cradle, and then laid back position. Baby latched well in laid back position. Baby likes to be curled up tight. Baby latched deeply, suckled rhythmically with a few swallows noted. Demonstrated hand expression to mom with a little colostrum present. Mom states that she has used DEBP twice. Mom's right breast is much smaller than left, but mom states that baby latches fine to right breast. Enc mom to offer lots of STS and attempts at breast. Enc mom to post pump after nursing because baby off and on breast. Enc mom to call for assistance as needed with latching and/or pumping. Enc mom to nurse with cues and at least every 3 hours due to baby's weight of less than 6 pounds.   Maternal Data    Feeding Feeding Type: Breast Fed Length of feed:  (LC assessed first 15 minutes of BF.)  LATCH Score/Interventions Latch: Repeated attempts needed to sustain latch, nipple held in mouth throughout feeding, stimulation needed to elicit sucking reflex. Intervention(s): Skin to skin;Teach feeding cues;Waking techniques Intervention(s): Adjust position;Assist with latch;Breast compression  Audible Swallowing: A few with stimulation Intervention(s): Skin to skin;Hand expression Intervention(s): Skin to skin;Hand expression  Type of Nipple: Everted at rest and after stimulation  Comfort (Breast/Nipple): Soft / non-tender     Hold (Positioning): Assistance needed to correctly position infant at breast and maintain latch. Intervention(s): Breastfeeding basics reviewed;Support Pillows;Position options;Skin to skin  LATCH Score: 7  Lactation Tools Discussed/Used     Consult Status Consult Status:  Follow-up Date: 05/12/15 Follow-up type: In-patient    Geralynn OchsWILLIARD, Teon Hudnall 05/11/2015, 2:28 PM

## 2015-05-11 NOTE — Lactation Note (Signed)
This note was copied from a baby's chart. Lactation Consultation Note New mom having difficulty latching baby. Baby aggressive to latch shallow. Demonstrated to mom how to do chin tug to obtain a deeper latch. Mom has very small breast tissue, especially Rt. Breast is noticeable smaller than Lt. Approximately 5 fingers width between breast. Rt. Breast has minimal breast tissue and appears to be flat up to Lt. Breast. Mom stated no soreness and not really a change in breast size during pregnancy. Lt. Breast has a size cup more breast tissue than Rt., but is still small. Both has same size nipples, small areola and cone shaped areola and nipple. Hand expression and breast massage noted drop of colostrum. 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 [place]ta [place] has a high palate, soft suckle on gloved finger when he would suckle. Assisted w/about full assist in latching in football hold. Baby slightly aggressive to obtain a shallow latch, when gentle chin tug done, baby stopped suckling. No swallows heard. Baby had BF well mom stated about 1 hour earlier, but [facility] Referred to Baby and Me Book in Breastfeeding section Pg. 22-23 for position options and Proper latch demonstration. Encouraged mom to post pump after BF. Discussed w/mom the importance of breast stimulation. Asked RN to set up DEBP. Mom encouraged to feed baby 8-12 times/24 hours and with feeding cues. Mom encouraged to waken baby for feeds.  Educated about newborn behavior, STS, I&O, cluster feeding, supply and demand. WH/LC brochure given w/resources, support groups and LC services.  Recommendations: Mom is to post-pump after BF w/DEBP.                                  SNS would be beneficial to baby since has low birth weight, as well as stimulation to mom. ( I felt that 6 hours old was to early of course to do this, and mom surprisingly have enough milk supply ). Bld. Sugars WDL. Patient Name: Lydia Jake SharkKaren Lawrence ZOXWR'UToday's Date: 05/11/2015 Reason for consult:  Initial assessment   Maternal Data Has patient been taught Hand Expression?: Yes Does the patient have breastfeeding experience prior to this delivery?: No  Feeding Feeding Type: Breast Fed Length of feed: 0 min  LATCH Score/Interventions Latch: Too sleepy or reluctant, no latch achieved, no sucking elicited. Intervention(s): Teach feeding cues;Skin to skin;Waking techniques  Audible Swallowing: None Intervention(s): Hand expression Intervention(s): Alternate breast massage  Type of Nipple: Everted at rest and after stimulation  Comfort (Breast/Nipple): Soft / non-tender     Hold (Positioning): Full assist, staff holds infant at breast Intervention(s): Breastfeeding basics reviewed;Support Pillows;Position options;Skin to skin  LATCH Score: 4  Lactation Tools Discussed/Used WIC Program: No   Consult Status Consult Status: Follow-up Date: 05/11/15 Follow-up type: In-patient    Charyl DancerCARVER, Lydia Lawrence 05/11/2015, 4:43 AM

## 2015-05-11 NOTE — Progress Notes (Signed)
MOB was referred for history of depression/anxiety.  Referral is screened out by Clinical Social Worker because none of the following criteria appear to apply: -History of anxiety/depression during this pregnancy, or of post-partum depression. - Diagnosis of anxiety and/or depression within last 3 years or -MOB's symptoms are currently being treated with medication and/or therapy. MOB is currently prescribed Zoloft. Prenatal record documents that she is stable on medication.   Please contact the Clinical Social Worker if needs arise or upon MOB request.   Loleta BooksSarah Kinisha Soper MSW, LCSW [phone]

## 2015-05-12 MED ORDER — IBUPROFEN 600 MG PO TABS: 600.0000 mg | ORAL_TABLET | Freq: Four times a day (QID) | ORAL | Status: AC

## 2015-05-12 NOTE — Discharge Summary (Signed)
Obstetric Discharge Summary  Reason for Admission: onset of labor Prenatal Procedures: none Intrapartum Procedures: spontaneous vaginal delivery Postpartum Procedures: none Complications-Operative and Postpartum: vaginal sulcus repair HEMOGLOBIN  Date Value Ref Range Status  05/11/2015 11.1* 12.0 - 15.0 g/dL Final    Comment:    DELTA CHECK NOTED REPEATED TO VERIFY    HCT  Date Value Ref Range Status  05/11/2015 32.9* 36.0 - 46.0 % Final    Physical Exam:  General: alert, cooperative and no distress Lochia: appropriate Uterine Fundus: firm Incision: healing well DVT Evaluation: No evidence of DVT seen on physical exam.  Discharge Diagnoses: Term Pregnancy-delivered  Discharge Information: Date: 05/12/2015 Activity: pelvic rest Diet: routine Medications: PNV and Ibuprofen Condition: stable Instructions: refer to practice specific booklet Discharge to: home Follow-up Information    Follow up with COUSINS,SHERONETTE A, MD. Schedule an appointment as soon as possible for a visit in 6 weeks.   Specialty:  Obstetrics and Gynecology   Contact information:   [address] [place] [id number] [phone]       Newborn Data: Live born female  Birth Weight: 5 lb 13 oz (2637 g) APGAR: 8, 10  Home with mother.  Marlinda MikeBAILEY, Jesselle Laflamme 05/12/2015, 4:58 PM

## 2015-05-12 NOTE — Progress Notes (Signed)
PPD 2 SVD  S:  Reports feeling well             Tolerating po/ No nausea or vomiting             Bleeding is light             Pain controlled with motrin             Up ad lib / ambulatory / voiding QS  Newborn breast feeding  O:               VS: BP 119/75 mmHg  Pulse 78  Temp(Src) 98.3 F (36.8 C) (Oral)  Resp 18  Ht 5\' 3"  (1.6 m)  Wt 65.772 kg (145 lb)  BMI 25.69 kg/m2  SpO2 97%  Breastfeeding? Unknown   LABS:              Recent Labs  05/10/15 2100 05/11/15 0543  WBC 23.5* 20.2*  HGB 14.0 11.1*  PLT 227 229               Blood type: --/--/O POS, O POS (03/20 2100)  Rubella: Immune (09/06 0000)                        Physical Exam:             Alert and oriented X3  Abdomen: soft, non-tender, non-distended              Fundus: firm, non-tender, U-1  Perineum: no edema  Lochia: light  Extremities: no edema, no calf pain or tenderness    A: PPD # 2   Doing well - stable status  P: Routine post partum orders  DC home - WOB booklet - instructions reviewed  Marlinda MikeBAILEY, TANYA CNM, MSN, [facility] 05/12/2015, 4:56 PM

## 2015-05-12 NOTE — Lactation Note (Signed)
This note was copied from a baby's chart. Lactation Consultation Note  Mom is having breast changes.  Her breasts are somewhat under developed in the lower quadrants but Glandular tissue is palpable throughout.  [name]'s muscle tone is relatively high.  He was placed in a laid back position and also in tummy time and he was more relaxed. Breast compression was needed to stimulate suckling. Once he began eating he consistently swallowed with the assistance of breast compression. Mom was advised to post pump 4 times in 24 hours and to spoon feed colostrum back to the baby. Recommended follow-up with [facility] tomorrow. Patient Name: Lydia Jake SharkKaren Costales EAVWU'JToday's Date: 05/12/2015 Reason for consult: Follow-up assessment   Maternal Data Has patient been taught Hand Expression?: Yes  Feeding Feeding Type: Breast Fed Length of feed: 30 min  LATCH Score/Interventions Latch: Grasps breast easily, tongue down, lips flanged, rhythmical sucking. Intervention(s): Teach feeding cues;Waking techniques Intervention(s): Adjust position;Assist with latch;Breast compression  Audible Swallowing: A few with stimulation Intervention(s): Hand expression  Type of Nipple: Everted at rest and after stimulation  Comfort (Breast/Nipple): Soft / non-tender  Problem noted: Mild/Moderate discomfort Interventions (Mild/moderate discomfort): Hand expression  Hold (Positioning): Assistance needed to correctly position infant at breast and maintain latch. Intervention(s): Breastfeeding basics reviewed;Support Pillows;Position options  LATCH Score: 8  Lactation Tools Discussed/Used     Consult Status Consult Status: Follow-up Date: 05/13/15 Follow-up type: Other (comment) (barb carder)    Soyla DryerJoseph, Denese Mentink 05/12/2015, 12:28 PM

## 2020-03-20 ENCOUNTER — Inpatient Hospital Stay (HOSPITAL_COMMUNITY): Payer: BC Managed Care – PPO

## 2020-03-20 ENCOUNTER — Encounter (HOSPITAL_COMMUNITY): Payer: Self-pay | Admitting: Emergency Medicine

## 2020-03-20 ENCOUNTER — Inpatient Hospital Stay (HOSPITAL_COMMUNITY)
Admission: AD | Admit: 2020-03-20 | Discharge: 2020-03-20 | Disposition: A | Discharge status: Home / Self Care | Payer: BC Managed Care – PPO | Attending: Obstetrics and Gynecology | Admitting: Obstetrics and Gynecology

## 2020-03-20 ENCOUNTER — Other Ambulatory Visit: Payer: Self-pay

## 2020-03-20 DIAGNOSIS — Z3202 Encounter for pregnancy test, result negative: Secondary | ICD-10-CM | POA: Diagnosis not present

## 2020-03-20 DIAGNOSIS — Z791 Long term (current) use of non-steroidal anti-inflammatories (NSAID): Secondary | ICD-10-CM | POA: Insufficient documentation

## 2020-03-20 DIAGNOSIS — Z881 Allergy status to other antibiotic agents status: Secondary | ICD-10-CM | POA: Diagnosis not present

## 2020-03-20 DIAGNOSIS — O209 Hemorrhage in early pregnancy, unspecified: Secondary | ICD-10-CM

## 2020-03-20 DIAGNOSIS — O039 Complete or unspecified spontaneous abortion without complication: Secondary | ICD-10-CM | POA: Insufficient documentation

## 2020-03-20 LAB — CBC
HCT: 35.2 % — ABNORMAL LOW (ref 36.0–46.0)
Hemoglobin: 11.6 g/dL — ABNORMAL LOW (ref 12.0–15.0)
MCH: 28.9 pg (ref 26.0–34.0)
MCHC: 33 g/dL (ref 30.0–36.0)
MCV: 87.8 fL (ref 80.0–100.0)
Platelets: 340 10*3/uL (ref 150–400)
RBC: 4.01 MIL/uL (ref 3.87–5.11)
RDW: 12.5 % (ref 11.5–15.5)
WBC: 10 10*3/uL (ref 4.0–10.5)
nRBC: 0 % (ref 0.0–0.2)

## 2020-03-20 LAB — HCG, QUANTITATIVE, PREGNANCY: hCG, Beta Chain, Quant, S: <1 m[IU]/mL (ref <5)

## 2020-03-20 NOTE — ED Notes (Signed)
Report called to [facility] in [facility].

## 2020-03-20 NOTE — MAU Provider Note (Signed)
Chief Complaint: Miscarriage and Vaginal Bleeding   None     SUBJECTIVE HPI: Lydia Lawrence is a 45 y.o. G3P1011 who is s/p Cytotec x 2 for missed abortion who presents to maternity admissions reporting onset of heavy vaginal bleeding soaking 1 pad/ hour today. She took second dose of Cytotec on 03/12/20 and has had bleeding daily since then but it was light until today. She reports fatigue with the bleeding. She denies pain.  There are no other symptoms she has not tried any treatments.    HPI  Past Medical History:  Diagnosis Date  . Medical history non-contributory   . Postpartum care following vaginal delivery (3/20) 05/11/2015   Past Surgical History:  Procedure Laterality Date  . GALLBLADDER SURGERY    . NO PAST SURGERIES     Social History   Socioeconomic History  . Marital status: Single    Spouse name: Not on file  . Number of children: Not on file  . Years of education: Not on file  . Highest education level: Not on file  Occupational History  . Not on file  Tobacco Use  . Smoking status: Never Smoker  . Smokeless tobacco: Never Used  Substance and Sexual Activity  . Alcohol use: No  . Drug use: No  . Sexual activity: Yes    Birth control/protection: None  Other Topics Concern  . Not on file  Social History Narrative  . Not on file   Social Determinants of Health   Financial Resource Strain: Not on file  Food Insecurity: Not on file  Transportation Needs: Not on file  Physical Activity: Not on file  Stress: Not on file  Social Connections: Not on file  Intimate Partner Violence: Not on file   No current facility-administered medications on file prior to encounter.   Current Outpatient Medications on File Prior to Encounter  Medication Sig Dispense Refill  . naproxen sodium (ALEVE) 220 MG tablet Take 220 mg by mouth.    Marland Kitchen acetaminophen (TYLENOL) 325 MG tablet Take 325 mg by mouth every 6 (six) hours as needed for mild pain.    Marland Kitchen ibuprofen (ADVIL,MOTRIN)  600 MG tablet Take 1 tablet (600 mg total) by mouth every 6 (six) hours. 30 tablet 0   Allergies  Allergen Reactions  . Keflex [Cephalexin] Rash  . Oxycodone-Acetaminophen Nausea Only and Rash    ROS:  Review of Systems  Constitutional: Negative for chills, fatigue and fever.  Respiratory: Negative for shortness of breath.   Cardiovascular: Negative for chest pain.  Gastrointestinal: Negative for nausea and vomiting.  Genitourinary: Positive for vaginal bleeding. Negative for difficulty urinating, dysuria, flank pain, pelvic pain, vaginal discharge and vaginal pain.  Neurological: Negative for dizziness and headaches.  Psychiatric/Behavioral: Negative.      I have reviewed patient's Past Medical Hx, Surgical Hx, Family Hx, Social Hx, medications and allergies.   Physical Exam   Patient Vitals for the past 24 hrs:  BP Temp Temp src Pulse Resp SpO2  03/20/20 1834 139/84 98.8 F (37.1 C) Oral 89 15 100 %  03/20/20 1705 (!) 148/98 98.5 F (36.9 C) Oral (!) 120 18 100 %   Constitutional: Well-developed, well-nourished female in no acute distress.  Cardiovascular: normal rate Respiratory: normal effort GI: Abd soft, non-tender. Pos BS x 4 MS: Extremities nontender, no edema, normal ROM Neurologic: Alert and oriented x 4.  GU: Neg CVAT.  PELVIC EXAM: Cervix pink, visually closed, without lesion, large clot x 1 removed from cervical os,  1 fox swab used to visualize cervix, vaginal walls and external genitalia normal Bimanual exam: Cervix 0/long/high, firm, anterior, neg CMT, uterus nontender, nonenlarged, adnexa without tenderness, enlargement, or mass    LAB RESULTS Results for orders placed or performed during the hospital encounter of 03/20/20 (from the past 24 hour(s))  CBC     Status: Abnormal   Collection Time: 03/20/20  7:38 PM  Result Value Ref Range   WBC 10.0 4.0 - 10.5 K/uL   RBC 4.01 3.87 - 5.11 MIL/uL   Hemoglobin 11.6 (L) 12.0 - 15.0 g/dL   HCT 10.9 (L) 32.3  - 46.0 %   MCV 87.8 80.0 - 100.0 fL   MCH 28.9 26.0 - 34.0 pg   MCHC 33.0 30.0 - 36.0 g/dL   RDW 55.7 32.2 - 02.5 %   Platelets 340 150 - 400 K/uL   nRBC 0.0 0.0 - 0.2 %  hCG, quantitative, pregnancy     Status: None   Collection Time: 03/20/20  7:38 PM  Result Value Ref Range   hCG, Beta Chain, Quant, S <1 <5 mIU/mL       IMAGING US OB Comp Less 14 Wks  Result Date: 03/20/2020 CLINICAL DATA:  Cytotec 1 week ago for incomplete miscarriage EXAM: OBSTETRIC <14 WK ULTRASOUND TECHNIQUE: Transabdominal ultrasound was performed for evaluation of the gestation as well as the maternal uterus and adnexal regions. COMPARISON:  None. FINDINGS: Intrauterine gestational sac: Absent Maternal uterus/adnexae: Right ovary is not well visualized. Left ovary demonstrates a dominant follicle with some associated adjacent calcification. No free fluid is noted. IMPRESSION: No intrauterine gestational sac is noted. Dominant follicle on the left with some associated calcification. This is of uncertain significance. Electronically Signed   By: Alcide Clever M.D.   On: 03/20/2020 21:30    MAU Management/MDM: Orders Placed This Encounter  Procedures  . US OB Comp Less 14 Wks  . CBC  . hCG, quantitative, pregnancy  . Discharge patient    No orders of the defined types were placed in this encounter.   Hcg and [place] today c/w resolved pregnancy. Bleeding today likely return of menses, even though pt with daily bleeding since second dose of Cytotec. Bleeding slowed after pt arrived in MAU and following pelvic exam. Bleeding precautions reviewed. Pt to f/u at [facility] as scheduled.  .  ASSESSMENT 1. Negative pregnancy test   2. Vaginal bleeding in pregnancy, first trimester   3. Complete miscarriage     PLAN Discharge home Allergies as of 03/20/2020      Reactions   Keflex [cephalexin] Rash   Oxycodone-acetaminophen Nausea Only, Rash      Medication List    TAKE these medications   acetaminophen 325 MG  tablet Commonly known as: TYLENOL Take 325 mg by mouth every 6 (six) hours as needed for mild pain.   ibuprofen 600 MG tablet Commonly known as: ADVIL Take 1 tablet (600 mg total) by mouth every 6 (six) hours.   naproxen sodium 220 MG tablet Commonly known as: ALEVE Take 220 mg by mouth.       Follow-up Information    [facility] Follow up.   Contact information: [address] [place] [address] [phone]               Sharen Counter Certified Nurse-Midwife 03/20/2020  10:26 PM

## 2020-03-20 NOTE — ED Triage Notes (Signed)
Emergency Medicine Provider OB Triage Evaluation Note  Lydia Lawrence is a 45 y.o. female, G2P1001, at Unknown gestation who presents to the emergency department with complaints of heavy vaginal bleeding.  Patient had a positive pregnancy test December 29.  She is followed by [facility].  Soon after the positive pregnancy test she is currently miscarrying.  She took Cytotec twice, her last dose was x1 week ago.  She thought she passed the tissue however since yesterday has been having heavy vaginal bleeding and passing walnut sized blood clots.  She states she is soaking through a pad an hour.  She does feel anxious, also endorsed weakness, fatigue.   Review of  Systems  Positive: Weakness, fatigue, vaginal bleeding. Negative: Syncope, palpitations, abdominal or pelvic pain.  Physical Exam  BP (!) 148/98 (BP Location: Left Arm)   Pulse (!) 120   Temp 98.5 F (36.9 C) (Oral)   Resp 18   SpO2 100%  General: Awake, no distress  HEENT: Atraumatic  Resp: Normal effort  Cardiac: tachycardic Abd: Nondistended, nontender  MSK: Moves all extremities without difficulty Neuro: Speech clear Psych: Anxious  Medical Decision Making  Pt evaluated for pregnancy concern and is stable for transfer to MAU. Pt is in agreement with plan for transfer.  5:21 PM Discussed with MAU APP, Eber Jones, who accepts patient in transfer.  Discussed that tachycardia likely related to anxiety as patient admitted to strangers herself, she is otherwise hemodynamically stable.  Clinical Impression  No diagnosis found.     Shanon Ace, PA-C 03/20/20 2222

## 2020-03-20 NOTE — MAU Note (Signed)
.   Lydia Lawrence is a 45 y.o. here in MAU reporting: heavy vaginal bleeding that started yesterday morning. She had been bleeding since she took her second cytotec on 03/12/20. It increased heavily yesterday. Now she is feeling faint. She rates her lower abdominal cramping a 3/10.   Vitals:   03/20/20 1705 03/20/20 1834  BP: (!) 148/98 139/84  Pulse: (!) 120 89  Resp: 18 15  Temp: 98.5 F (36.9 C) 98.8 F (37.1 C)  SpO2: 100% 100%

## 2020-03-20 NOTE — ED Triage Notes (Signed)
Pt reports taking 2nd pill post miscarriage 1 week ago.  Having heavy vaginal bleeding with clots yesterday and today.  Abd cramping.

## 2020-03-20 NOTE — ED Notes (Signed)
PA at triage to assess pt for MAU.

## 2020-03-23 LAB — SURGICAL PATHOLOGY

## 2021-11-23 ENCOUNTER — Other Ambulatory Visit: Payer: Self-pay | Admitting: Gastroenterology

## 2021-11-23 ENCOUNTER — Ambulatory Visit
Admission: RE | Admit: 2021-11-23 | Discharge: 2021-11-23 | Disposition: A | Discharge status: Home / Self Care | Payer: BC Managed Care – PPO | Source: Ambulatory Visit | Attending: Gastroenterology | Admitting: Gastroenterology

## 2021-11-23 DIAGNOSIS — K582 Mixed irritable bowel syndrome: Secondary | ICD-10-CM

## 2022-04-21 ENCOUNTER — Other Ambulatory Visit (HOSPITAL_COMMUNITY): Payer: Self-pay

## 2022-04-21 MED ORDER — METHYLPHENIDATE HCL ER (CD) 40 MG PO CPCR
40.0000 mg | ORAL_CAPSULE | Freq: Every day | ORAL | 0 refills | Status: AC
  Filled 2022-04-21: qty 30, 30d supply, fill #0

## 2022-05-01 ENCOUNTER — Other Ambulatory Visit (HOSPITAL_COMMUNITY): Payer: Self-pay

## 2022-06-01 ENCOUNTER — Other Ambulatory Visit (HOSPITAL_COMMUNITY): Payer: Self-pay

## 2022-06-01 MED ORDER — DEXMETHYLPHENIDATE HCL ER 20 MG PO CP24
20.0000 mg | ORAL_CAPSULE | Freq: Every day | ORAL | 0 refills | Status: DC
  Filled 2022-06-01: qty 30, 30d supply, fill #0

## 2022-06-27 ENCOUNTER — Other Ambulatory Visit (HOSPITAL_COMMUNITY): Payer: Self-pay

## 2022-06-27 MED ORDER — DEXMETHYLPHENIDATE HCL ER 20 MG PO CP24
20.0000 mg | ORAL_CAPSULE | Freq: Every day | ORAL | 0 refills | Status: DC
  Filled 2022-06-27 – 2022-07-03 (×2): qty 30, 30d supply, fill #0

## 2022-07-03 ENCOUNTER — Other Ambulatory Visit (HOSPITAL_COMMUNITY): Payer: Self-pay

## 2022-08-03 ENCOUNTER — Other Ambulatory Visit (HOSPITAL_COMMUNITY): Payer: Self-pay

## 2022-08-03 MED ORDER — METHYLPHENIDATE HCL 10 MG PO TABS
10.0000 mg | ORAL_TABLET | Freq: Every day | ORAL | 0 refills | Status: AC
  Filled 2022-08-03: qty 12, 12d supply, fill #0
  Filled 2022-08-03: qty 18, 18d supply, fill #0

## 2022-08-03 MED ORDER — DEXMETHYLPHENIDATE HCL ER 20 MG PO CP24
20.0000 mg | ORAL_CAPSULE | Freq: Every morning | ORAL | 0 refills | Status: DC
  Filled 2022-08-03: qty 30, 30d supply, fill #0

## 2022-08-05 ENCOUNTER — Other Ambulatory Visit (HOSPITAL_COMMUNITY): Payer: Self-pay

## 2022-09-07 ENCOUNTER — Other Ambulatory Visit (HOSPITAL_COMMUNITY): Payer: Self-pay

## 2022-09-07 MED ORDER — DEXMETHYLPHENIDATE HCL ER 20 MG PO CP24
20.0000 mg | ORAL_CAPSULE | Freq: Every morning | ORAL | 0 refills | Status: AC
  Filled 2022-09-07 – 2022-09-22 (×2): qty 30, 30d supply, fill #0

## 2022-09-15 ENCOUNTER — Other Ambulatory Visit (HOSPITAL_COMMUNITY): Payer: Self-pay

## 2022-09-22 ENCOUNTER — Other Ambulatory Visit (HOSPITAL_COMMUNITY): Payer: Self-pay

## 2022-09-22 ENCOUNTER — Other Ambulatory Visit: Payer: Self-pay

## 2022-09-23 ENCOUNTER — Other Ambulatory Visit (HOSPITAL_COMMUNITY): Payer: Self-pay

## 2023-01-28 IMAGING — US US OB COMP LESS 14 WK
1 series · 16 of 23 positions shown · non-contrast
Comparison: None.

CLINICAL DATA: Cytotec 1 week ago for incomplete miscarriage

EXAM:
OBSTETRIC <14 WK ULTRASOUND
TECHNIQUE: Transabdominal ultrasound was performed for evaluation of the
gestation as well as the maternal uterus and adnexal regions.

[Series 1: us ob comp less 14 wk · 23 acquisitions, 16 frames shown]
[im 1/23]
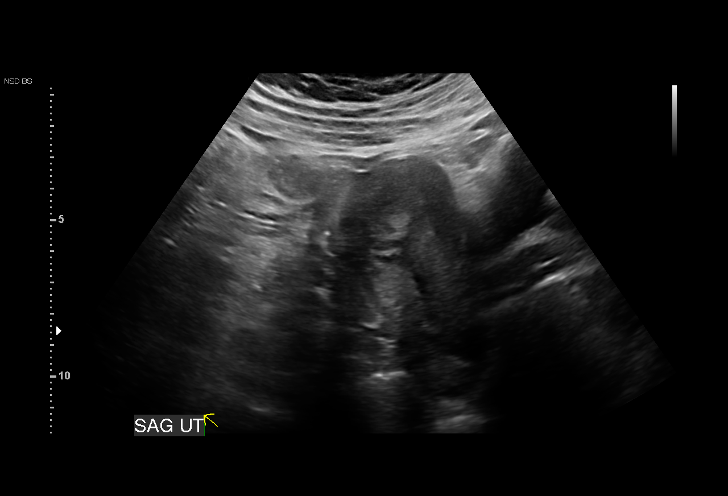
[im 3/23]
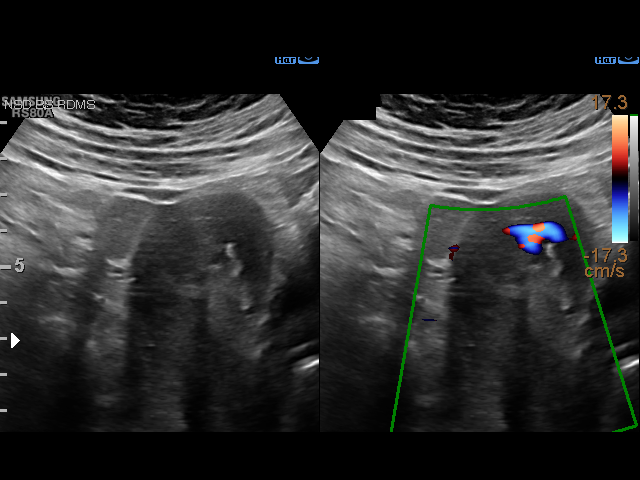
[im 4/23]
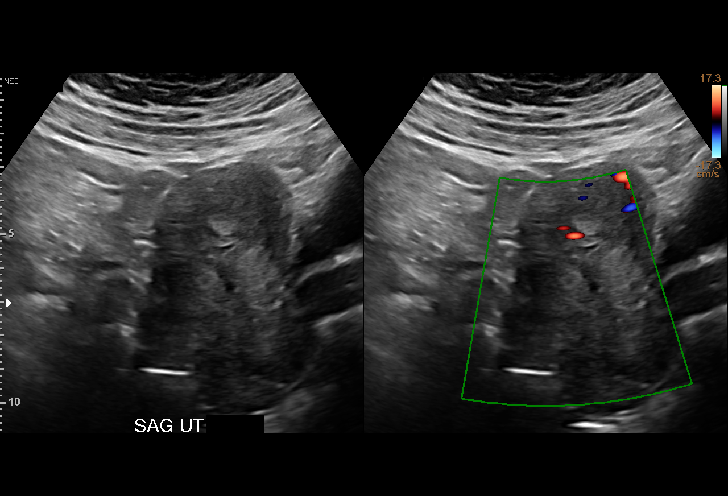
[im 6/23]
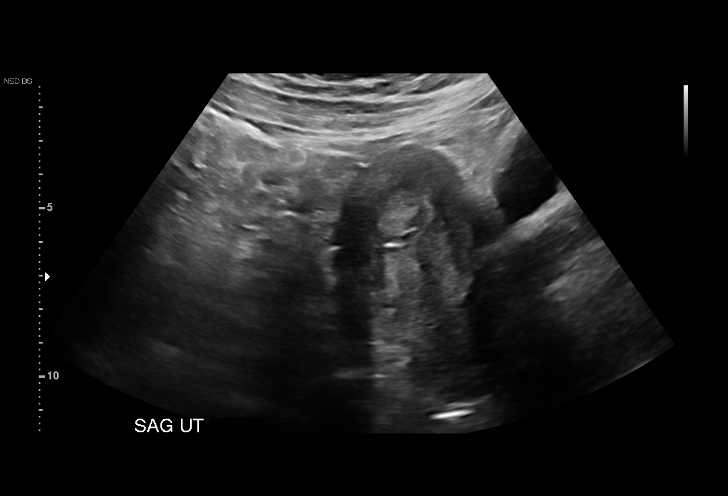
[im 7/23]
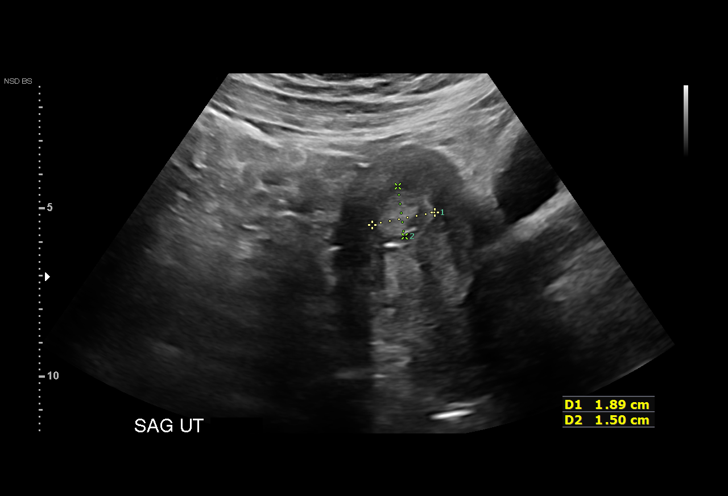
[im 8/23]
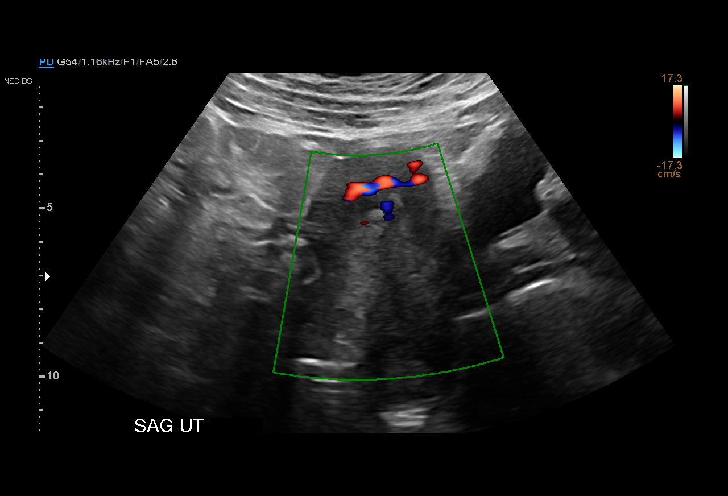
[im 10/23]
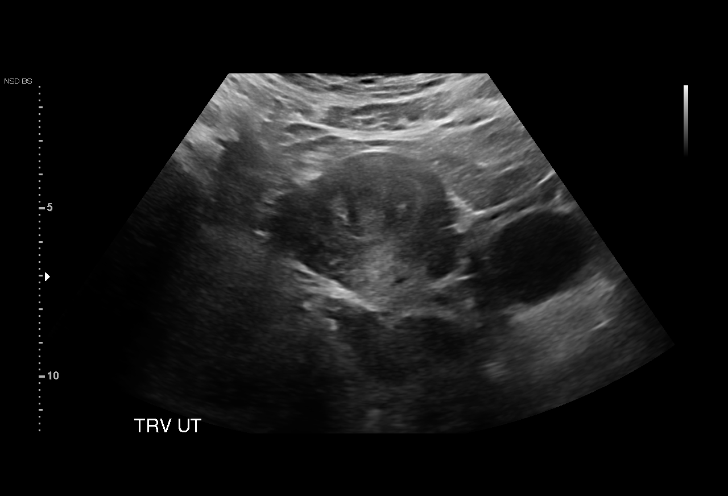
[im 11/23]
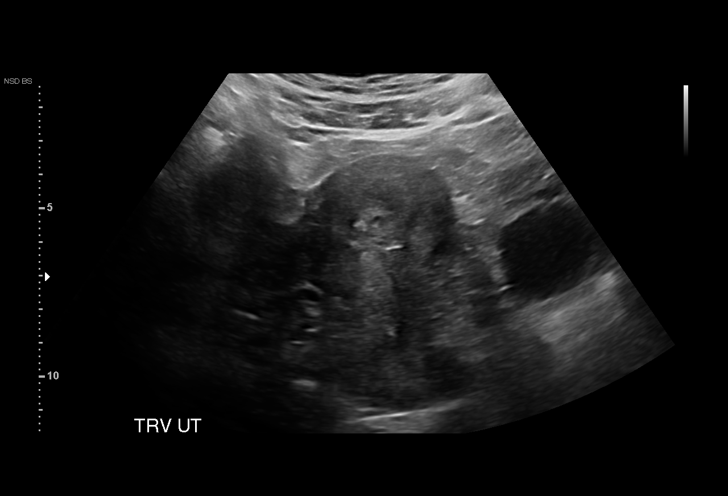
[im 13/23]
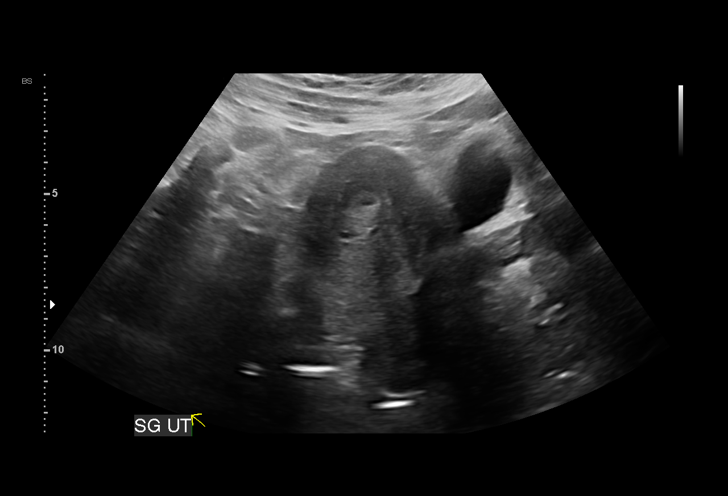
[im 14/23]
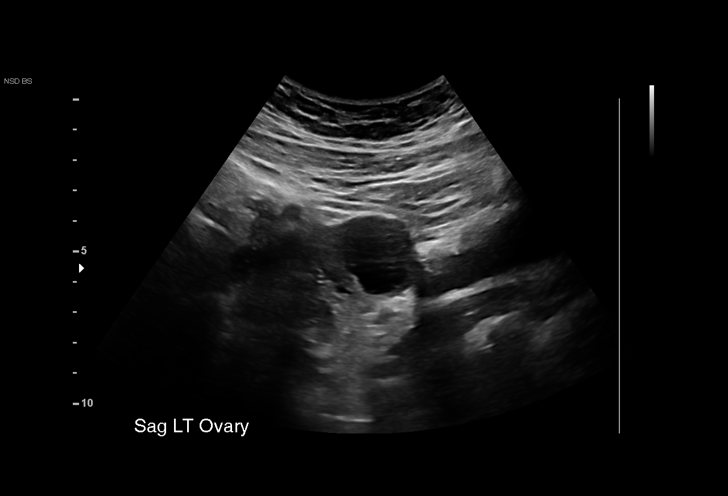
[im 16/23]
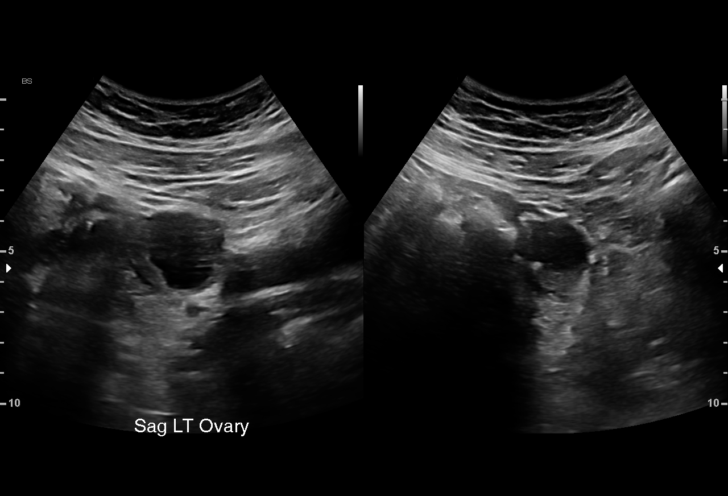
[im 17/23]
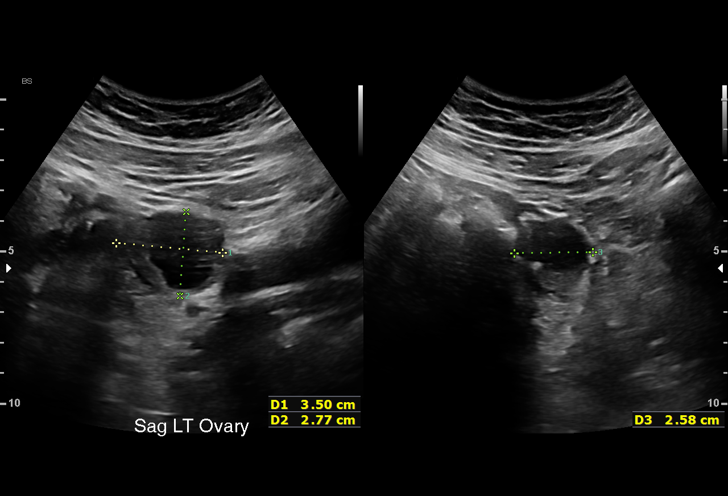
[im 18/23]
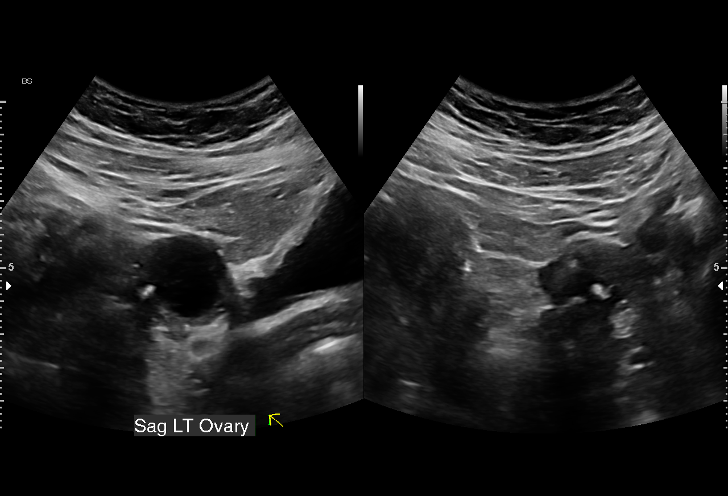
[im 20/23]
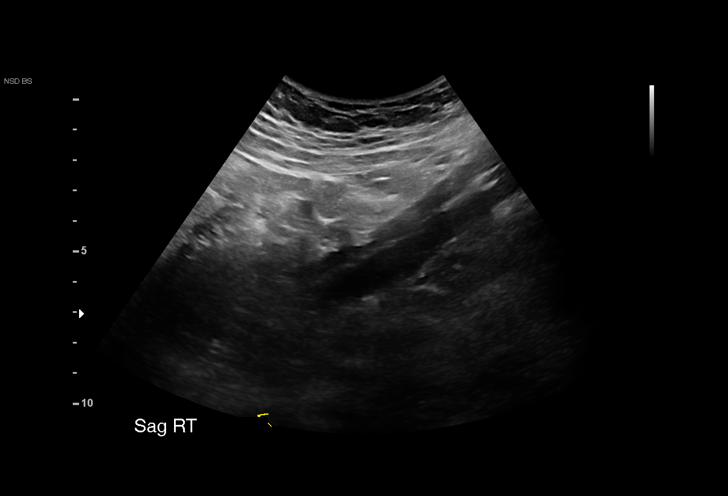
[im 21/23]
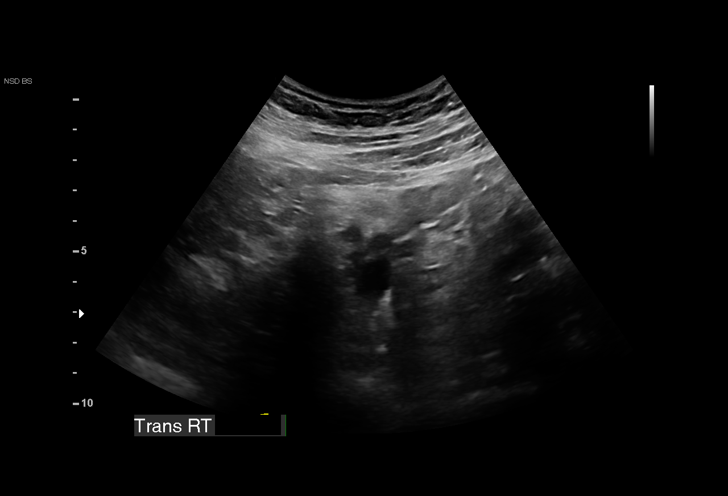
[im 23/23]
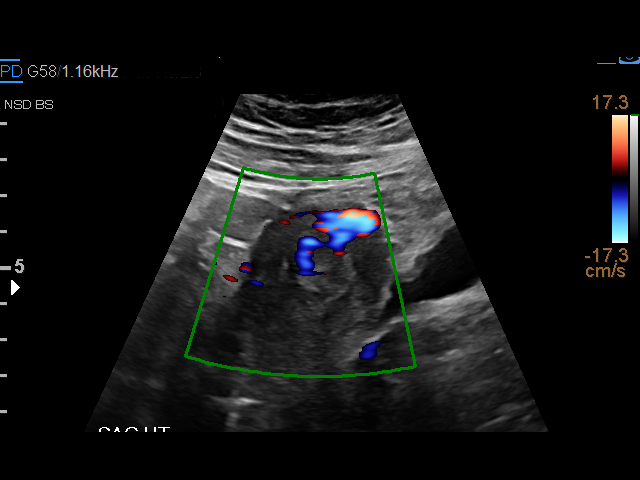

[16 of 23 positions shown; findings below may reference images not displayed]

FINDINGS: Intrauterine gestational sac: Absent

Maternal uterus/adnexae: Right ovary is not well visualized. Left
ovary demonstrates a dominant follicle with some associated adjacent
calcification. No free fluid is noted.
IMPRESSION: No intrauterine gestational sac is noted.

Dominant follicle on the left with some associated calcification.
This is of uncertain significance.

## 2023-02-08 ENCOUNTER — Other Ambulatory Visit (HOSPITAL_COMMUNITY): Payer: Self-pay

## 2023-02-08 MED ORDER — DEXMETHYLPHENIDATE HCL ER 30 MG PO CP24
30.0000 mg | ORAL_CAPSULE | Freq: Every morning | ORAL | 0 refills | Status: AC
  Filled 2023-02-08: qty 14, 14d supply, fill #0

## 2023-02-08 MED ORDER — METHYLPHENIDATE HCL 20 MG PO TABS
20.0000 mg | ORAL_TABLET | Freq: Every day | ORAL | 0 refills | Status: AC
  Filled 2023-02-08: qty 14, 14d supply, fill #0

## 2023-02-09 ENCOUNTER — Other Ambulatory Visit (HOSPITAL_COMMUNITY): Payer: Self-pay

## 2023-08-14 ENCOUNTER — Other Ambulatory Visit (HOSPITAL_COMMUNITY): Payer: Self-pay

## 2023-08-14 MED ORDER — METHYLPHENIDATE HCL 20 MG PO TABS
20.0000 mg | ORAL_TABLET | Freq: Every day | ORAL | 0 refills | Status: AC
  Filled 2023-08-14: qty 30, 30d supply, fill #0

## 2023-08-14 MED ORDER — DEXMETHYLPHENIDATE HCL ER 30 MG PO CP24
1.0000 | ORAL_CAPSULE | Freq: Every morning | ORAL | 0 refills | Status: DC
  Filled 2023-08-14: qty 30, 30d supply, fill #0

## 2023-08-14 MED ORDER — ALPRAZOLAM 0.5 MG PO TABS
0.5000 mg | ORAL_TABLET | Freq: Every day | ORAL | 0 refills | Status: AC | PRN
  Filled 2023-08-14: qty 30, 30d supply, fill #0

## 2023-09-19 ENCOUNTER — Other Ambulatory Visit (HOSPITAL_COMMUNITY): Payer: Self-pay

## 2023-09-19 MED ORDER — DEXMETHYLPHENIDATE HCL ER 30 MG PO CP24
30.0000 mg | ORAL_CAPSULE | Freq: Every morning | ORAL | 0 refills | Status: AC
  Filled 2023-09-19: qty 25, 25d supply, fill #0
  Filled 2023-09-19: qty 5, 5d supply, fill #0
